# Patient Record
Sex: Female | Born: 1945 | Race: Black or African American | Hispanic: No | Marital: Single | Smoking: Never smoker
Health system: Southern US, Community
[De-identification: ages and names within clinical notes are randomized; demographics above are authoritative.]

## PROBLEM LIST (undated history)

## (undated) DIAGNOSIS — E119 Type 2 diabetes mellitus without complications: Secondary | ICD-10-CM

## (undated) DIAGNOSIS — I10 Essential (primary) hypertension: Secondary | ICD-10-CM

## (undated) DIAGNOSIS — I2510 Atherosclerotic heart disease of native coronary artery without angina pectoris: Secondary | ICD-10-CM

---

## 2019-05-08 ENCOUNTER — Emergency Department (HOSPITAL_COMMUNITY)
Admission: EM | Admit: 2019-05-08 | Discharge: 2019-05-09 | Disposition: A | Discharge status: Home / Self Care | Payer: Medicare Other | Attending: Emergency Medicine | Admitting: Emergency Medicine

## 2019-05-08 ENCOUNTER — Other Ambulatory Visit: Payer: Self-pay

## 2019-05-08 ENCOUNTER — Encounter (HOSPITAL_COMMUNITY): Payer: Self-pay

## 2019-05-08 ENCOUNTER — Emergency Department (HOSPITAL_COMMUNITY): Payer: Medicare Other

## 2019-05-08 DIAGNOSIS — I10 Essential (primary) hypertension: Secondary | ICD-10-CM | POA: Diagnosis not present

## 2019-05-08 DIAGNOSIS — E119 Type 2 diabetes mellitus without complications: Secondary | ICD-10-CM | POA: Diagnosis not present

## 2019-05-08 DIAGNOSIS — I259 Chronic ischemic heart disease, unspecified: Secondary | ICD-10-CM | POA: Insufficient documentation

## 2019-05-08 DIAGNOSIS — M25512 Pain in left shoulder: Secondary | ICD-10-CM | POA: Diagnosis present

## 2019-05-08 DIAGNOSIS — T148XXA Other injury of unspecified body region, initial encounter: Secondary | ICD-10-CM

## 2019-05-08 HISTORY — DX: Atherosclerotic heart disease of native coronary artery without angina pectoris: I25.10

## 2019-05-08 HISTORY — DX: Type 2 diabetes mellitus without complications: E11.9

## 2019-05-08 HISTORY — DX: Essential (primary) hypertension: I10

## 2019-05-08 LAB — CBC
HCT: 39.3 % (ref 36.0–46.0)
Hemoglobin: 12.6 g/dL (ref 12.0–15.0)
MCH: 30.4 pg (ref 26.0–34.0)
MCHC: 32.1 g/dL (ref 30.0–36.0)
MCV: 94.7 fL (ref 80.0–100.0)
Platelets: 225 10*3/uL (ref 150–400)
RBC: 4.15 MIL/uL (ref 3.87–5.11)
RDW: 12 % (ref 11.5–15.5)
WBC: 6.3 10*3/uL (ref 4.0–10.5)
nRBC: 0 % (ref 0.0–0.2)

## 2019-05-08 LAB — BASIC METABOLIC PANEL
Anion gap: 11 (ref 5–15)
BUN: 13 mg/dL (ref 8–23)
CO2: 28 mmol/L (ref 22–32)
Calcium: 9 mg/dL (ref 8.9–10.3)
Chloride: 101 mmol/L (ref 98–111)
Creatinine, Ser: 1 mg/dL (ref 0.44–1.00)
GFR calc Af Amer: >60 mL/min (ref >60)
GFR calc non Af Amer: 55 mL/min — ABNORMAL LOW (ref >60)
Glucose, Bld: 165 mg/dL — ABNORMAL HIGH (ref 70–99)
Potassium: 3.5 mmol/L (ref 3.5–5.1)
Sodium: 140 mmol/L (ref 135–145)

## 2019-05-08 LAB — TROPONIN I (HIGH SENSITIVITY)
Troponin I (High Sensitivity): 3 ng/L (ref <18)
Troponin I (High Sensitivity): 4 ng/L (ref <18)

## 2019-05-08 MED ORDER — KETOROLAC TROMETHAMINE 60 MG/2ML IM SOLN: 30.0000 mg | Freq: Once | INTRAMUSCULAR | Status: DC

## 2019-05-08 MED ORDER — KETOROLAC TROMETHAMINE 60 MG/2ML IM SOLN
30.0000 mg | Freq: Once | INTRAMUSCULAR | Status: AC
  Administered 2019-05-08: 30 mg via INTRAMUSCULAR
  Filled 2019-05-08: qty 2

## 2019-05-08 MED ORDER — KETOROLAC TROMETHAMINE 15 MG/ML IJ SOLN: 15.0000 mg | Freq: Once | INTRAMUSCULAR | Status: DC

## 2019-05-08 MED ORDER — LIDOCAINE 5 % EX PTCH
1.0000 | MEDICATED_PATCH | CUTANEOUS | Status: DC
  Administered 2019-05-08: 1 via TRANSDERMAL
  Filled 2019-05-08: qty 1

## 2019-05-08 MED ORDER — SODIUM CHLORIDE 0.9% FLUSH: 3.0000 mL | Freq: Once | INTRAVENOUS | Status: DC

## 2019-05-08 NOTE — ED Triage Notes (Signed)
Pt arrives POV for eval of abnormal EKG. Pt reports that she started on Sunday w/ a "crook in her neck", and tried OTC abx. Pt reports that she went to PCP for eval and noted that she had an "abnormal EKG". Pt denies CP/SOB. Endorses ongoing neck/shoulder pain

## 2019-05-09 ENCOUNTER — Encounter (HOSPITAL_COMMUNITY): Payer: Self-pay | Admitting: Emergency Medicine

## 2019-05-09 MED ORDER — DICLOFENAC SODIUM 1 % TD GEL: 4.0000 g | Freq: Four times a day (QID) | TRANSDERMAL | 0 refills | Status: AC

## 2019-05-09 MED ORDER — LIDOCAINE 5 % EX PTCH: 1.0000 | MEDICATED_PATCH | CUTANEOUS | 0 refills | Status: AC

## 2019-05-09 NOTE — ED Notes (Signed)
Discharge reviewed with pt and family. Verbalized understanding

## 2019-05-09 NOTE — ED Provider Notes (Signed)
[facility]PART[facility] Provider Note   CSN: [id number] Arrival date & time: 05/08/19  1603     History   Chief Complaint Chief Complaint  Patient presents with  . Neck Pain  . Shoulder Pain    HPI Catherine Blevins is a 73 y.o. female.     The history is provided by the patient.  Shoulder Pain Location:  Shoulder Shoulder location:  L shoulder Injury: no   Pain details:    Quality:  Aching and cramping   Radiates to:  Does not radiate   Severity:  Moderate   Onset quality:  Gradual   Timing:  Constant   Progression:  Unchanged Dislocation: no   Foreign body present:  No foreign bodies Prior injury to area:  No Relieved by:  Nothing Worsened by:  Nothing Ineffective treatments:  None tried Associated symptoms: stiffness   Associated symptoms: no back pain, no decreased range of motion, no fatigue, no fever, no muscle weakness, no neck pain and no numbness   Risk factors: no concern for non-accidental trauma   Seen for pain at urgent care and they did EKG and said it was abnormal and sent patient in.  Patient denies CP, DOE, n/v/d.  No weakness no numbness.  Had a recent normal cardiac catheterization in [place].  Has her old EKG with her.    Past Medical History:  Diagnosis Date  . Coronary artery disease   . Diabetes mellitus without complication ([place])   . Hypertension     There are no active problems to display for this patient.   History reviewed. No pertinent surgical history.   OB History   No obstetric history on file.      Home Medications    Prior to Admission medications   Not on File    Family History History reviewed. No pertinent family history.  Social History Social History   Tobacco Use  . Smoking status: Never Smoker  . Smokeless tobacco: Never Used  Substance Use Topics  . Alcohol use: Never    Frequency: Never  . Drug use: Never     Allergies   Patient has no known allergies.   Review of  Systems Review of Systems  Constitutional: Negative for fatigue and fever.  HENT: Negative for sore throat.   Eyes: Negative for visual disturbance.  Respiratory: Negative for chest tightness and shortness of breath.   Cardiovascular: Negative for chest pain, palpitations and leg swelling.  Gastrointestinal: Negative for abdominal pain.  Genitourinary: Negative for dysuria.  Musculoskeletal: Positive for arthralgias, myalgias and stiffness. Negative for back pain and neck pain.  Neurological: Negative for headaches.  Psychiatric/Behavioral: Negative for agitation.  All other systems reviewed and are negative.    Physical Exam Updated Vital Signs BP (!) 156/91   Pulse (!) 106   Temp 98.8 F (37.1 C)   Resp (!) 22   Ht 5\' 6"  (1.676 m)   Wt 89.8 kg   SpO2 100%   BMI 31.96 kg/m   Physical Exam Vitals signs and nursing note reviewed.  Constitutional:      General: She is not in acute distress.    Appearance: She is normal weight.  HENT:     Head: Normocephalic and atraumatic.     Nose: Nose normal.     Mouth/Throat:     Mouth: Mucous membranes are moist.     Pharynx: Oropharynx is clear.  Eyes:     Conjunctiva/sclera: Conjunctivae normal.  Pupils: Pupils are equal, round, and reactive to light.  Neck:     Musculoskeletal: Normal range of motion and neck supple.  Cardiovascular:     Rate and Rhythm: Normal rate and regular rhythm.     Pulses: Normal pulses.     Heart sounds: Normal heart sounds.  Pulmonary:     Effort: Pulmonary effort is normal.     Breath sounds: Normal breath sounds.  Abdominal:     General: Abdomen is flat. Bowel sounds are normal.     Tenderness: There is no abdominal tenderness. There is no guarding.  Genitourinary:    General: Normal vulva.     Rectum: Normal.  Musculoskeletal: Normal range of motion.     Left shoulder: Normal.     Left elbow: Normal.     Left wrist: Normal.       Arms:     Left hand: Normal. She exhibits normal  capillary refill. Normal sensation noted. Normal strength noted.  Neurological:     Mental Status: She is alert.      ED Treatments / Results  Labs (all labs ordered are listed, but only abnormal results are displayed) Results for orders placed or performed during the hospital encounter of 05/08/19  Basic metabolic panel  Result Value Ref Range   Sodium 140 135 - 145 mmol/L   Potassium 3.5 3.5 - 5.1 mmol/L   Chloride 101 98 - 111 mmol/L   CO2 28 22 - 32 mmol/L   Glucose, Bld 165 (H) 70 - 99 mg/dL   BUN 13 8 - 23 mg/dL   Creatinine, Ser 1.611.00 0.44 - 1.00 mg/dL   Calcium 9.0 8.9 - 09.610.3 mg/dL   GFR calc non Af Amer 55 (L) >60 mL/min   GFR calc Af Amer >60 >60 mL/min   Anion gap 11 5 - 15  CBC  Result Value Ref Range   WBC 6.3 4.0 - 10.5 K/uL   RBC 4.15 3.87 - 5.11 MIL/uL   Hemoglobin 12.6 12.0 - 15.0 g/dL   HCT 04.539.3 40.936.0 - 81.146.0 %   MCV 94.7 80.0 - 100.0 fL   MCH 30.4 26.0 - 34.0 pg   MCHC 32.1 30.0 - 36.0 g/dL   RDW 91.412.0 78.211.5 - 95.615.5 %   Platelets 225 150 - 400 K/uL   nRBC 0.0 0.0 - 0.2 %  Troponin I (High Sensitivity)  Result Value Ref Range   Troponin I (High Sensitivity) 3 <18 ng/L  Troponin I (High Sensitivity)  Result Value Ref Range   Troponin I (High Sensitivity) 4 <18 ng/L   Dg Chest 2 View  Result Date: 05/08/2019 CLINICAL DATA:  Chest pain EXAM: CHEST - 2 VIEW COMPARISON:  None. FINDINGS: The heart size and mediastinal contours are within normal limits. Overlying median sternotomy wires and surgical clips are seen. both lungs are clear. The visualized skeletal structures are unremarkable. IMPRESSION: No active cardiopulmonary disease. Electronically Signed   By: Jonna ClarkBindu  Avutu M.D.   On: 05/08/2019 17:10    EKG EKG Interpretation  Date/Time:  Thursday May 08 2019 16:16:20 EDT Ventricular Rate:  85 PR Interval:  156 QRS Duration: 144 QT Interval:  446 QTC Calculation: 530 R Axis:   -116 Text Interpretation:  Normal sinus rhythm Right bundle branch block  Anterolateral infarct , age undetermined Confirmed by Nicanor AlconPalumbo, Bane Hagy ([id number]) on 05/08/2019 11:04:35 PM   Radiology Dg Chest 2 View  Result Date: 05/08/2019 CLINICAL DATA:  Chest pain EXAM: CHEST - 2 VIEW COMPARISON:  None. FINDINGS: The heart size and mediastinal contours are within normal limits. Overlying median sternotomy wires and surgical clips are seen. both lungs are clear. The visualized skeletal structures are unremarkable. IMPRESSION: No active cardiopulmonary disease. Electronically Signed   By: Jonna ClarkBindu  Avutu M.D.   On: 05/08/2019 17:10    Procedures Procedures (including critical care time)  Medications Ordered in ED Medications  sodium chloride flush (NS) 0.9 % injection 3 mL (has no administration in time range)  lidocaine (LIDODERM) 5 % 1 patch (1 patch Transdermal Patch Applied 05/08/19 2354)  ketorolac (TORADOL) injection 30 mg (30 mg Intramuscular Given 05/08/19 2354)      Symptoms are reproducible and clearly MSK in nature.  Ruled out for MI in the ED>  I have reviewed outside EKG and our EKG is the same as her previous.   Final Clinical Impressions(s) / ED Diagnoses    Return for intractable cough, coughing up blood,fevers >100.4 unrelieved by medication, shortness of breath, intractable vomiting, chest pain, shortness of breath, weakness,numbness, changes in speech, facial asymmetry,abdominal pain, passing out,Inability to tolerate liquids or food, cough, altered mental status or any concerns. No signs of systemic illness or infection. The patient is nontoxic-appearing on exam and vital signs are within normal limits.   I have reviewed the triage vital signs and the nursing notes. Pertinent labs &imaging results that were available during my care of the patient were reviewed by me and considered in my medical decision making (see chart for details).  After history, exam, and medical workup I feel the patient has been appropriately medically screened and is  safe for discharge home. Pertinent diagnoses were discussed with the patient. Patient was given return precautions   Deiona Hooper, MD 05/09/19 [id number]

## 2020-09-01 IMAGING — DX CHEST - 2 VIEW
2 series · 2 of 2 positions shown · non-contrast
Comparison: None.

CLINICAL DATA: Chest pain

EXAM:
CHEST - 2 VIEW

[chest pa]
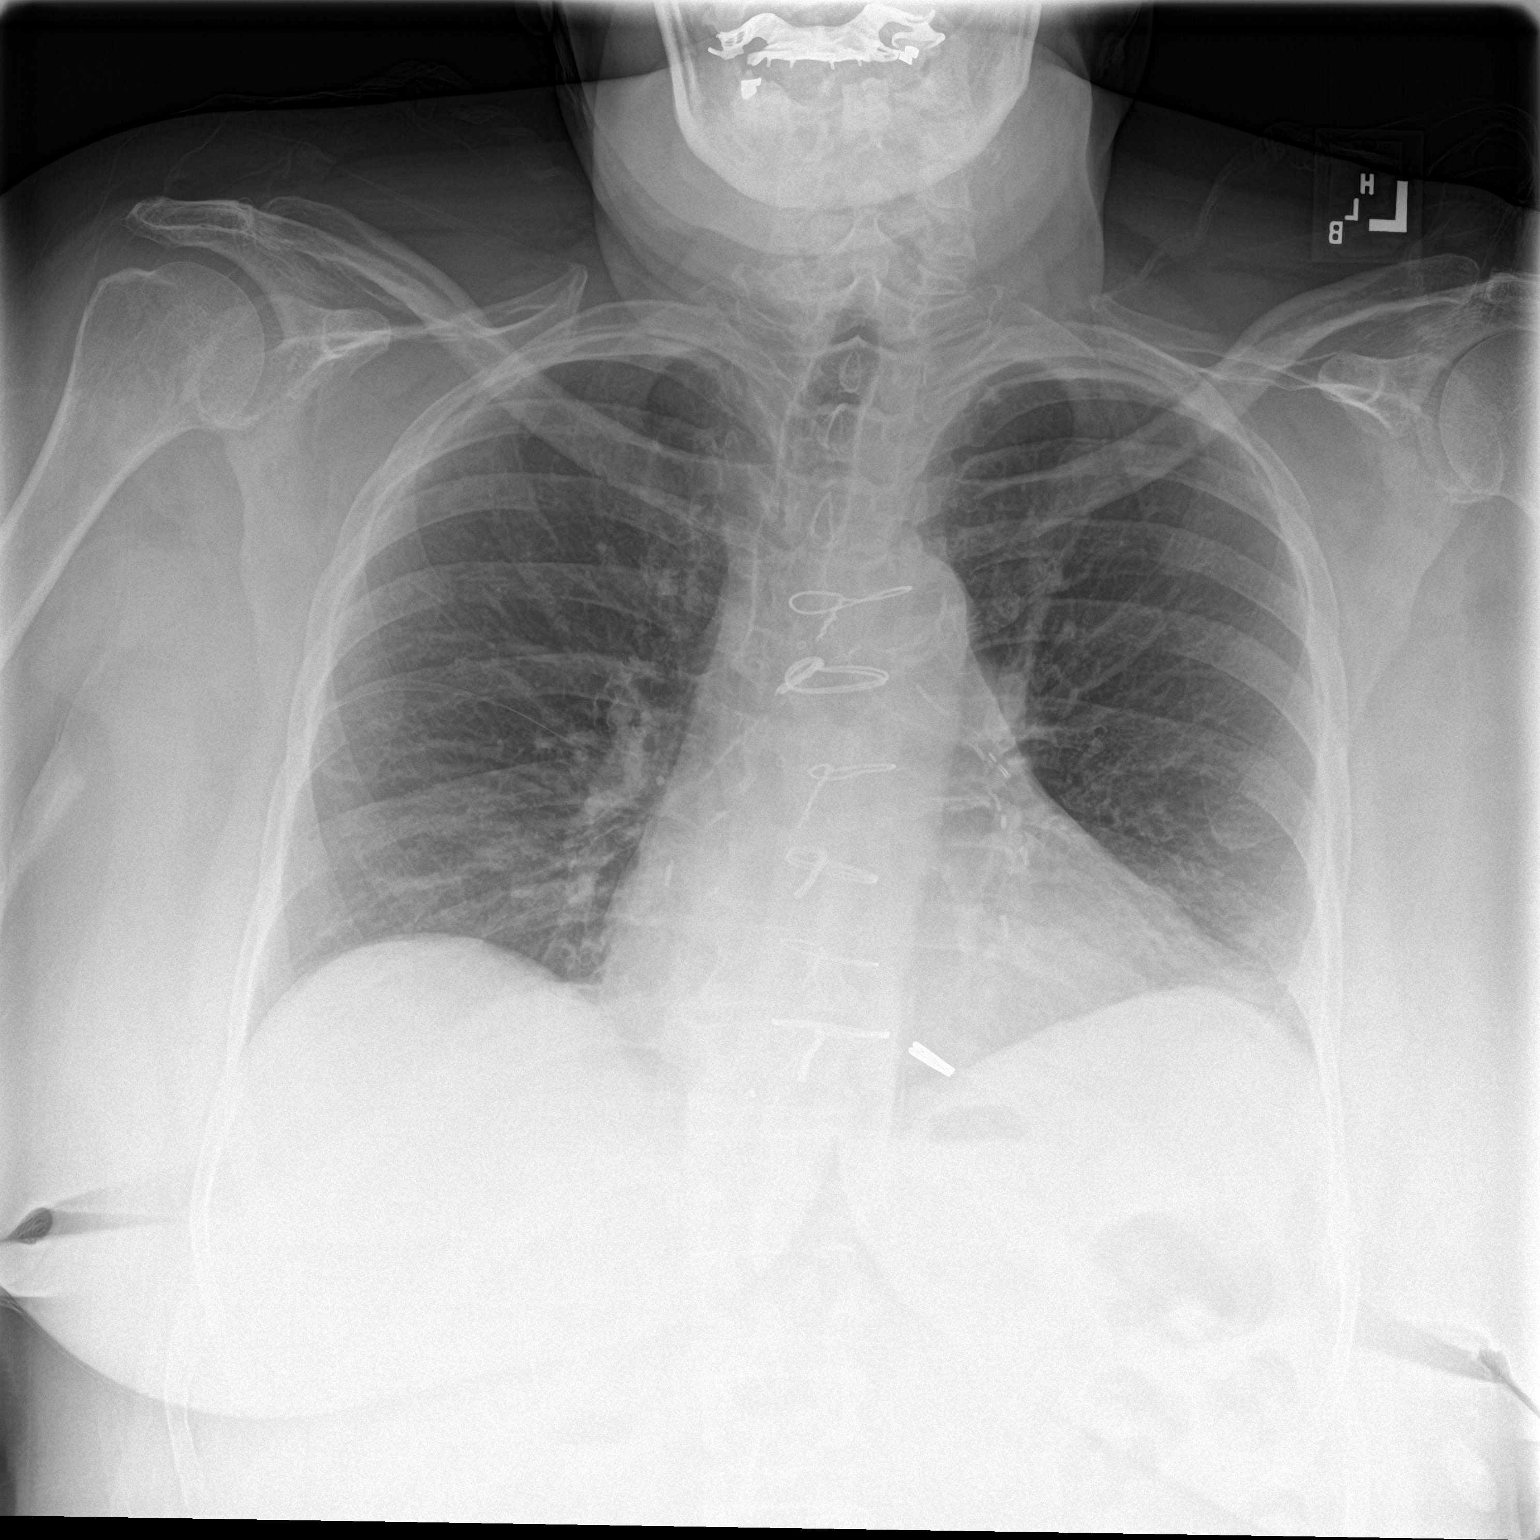

[chest lat]
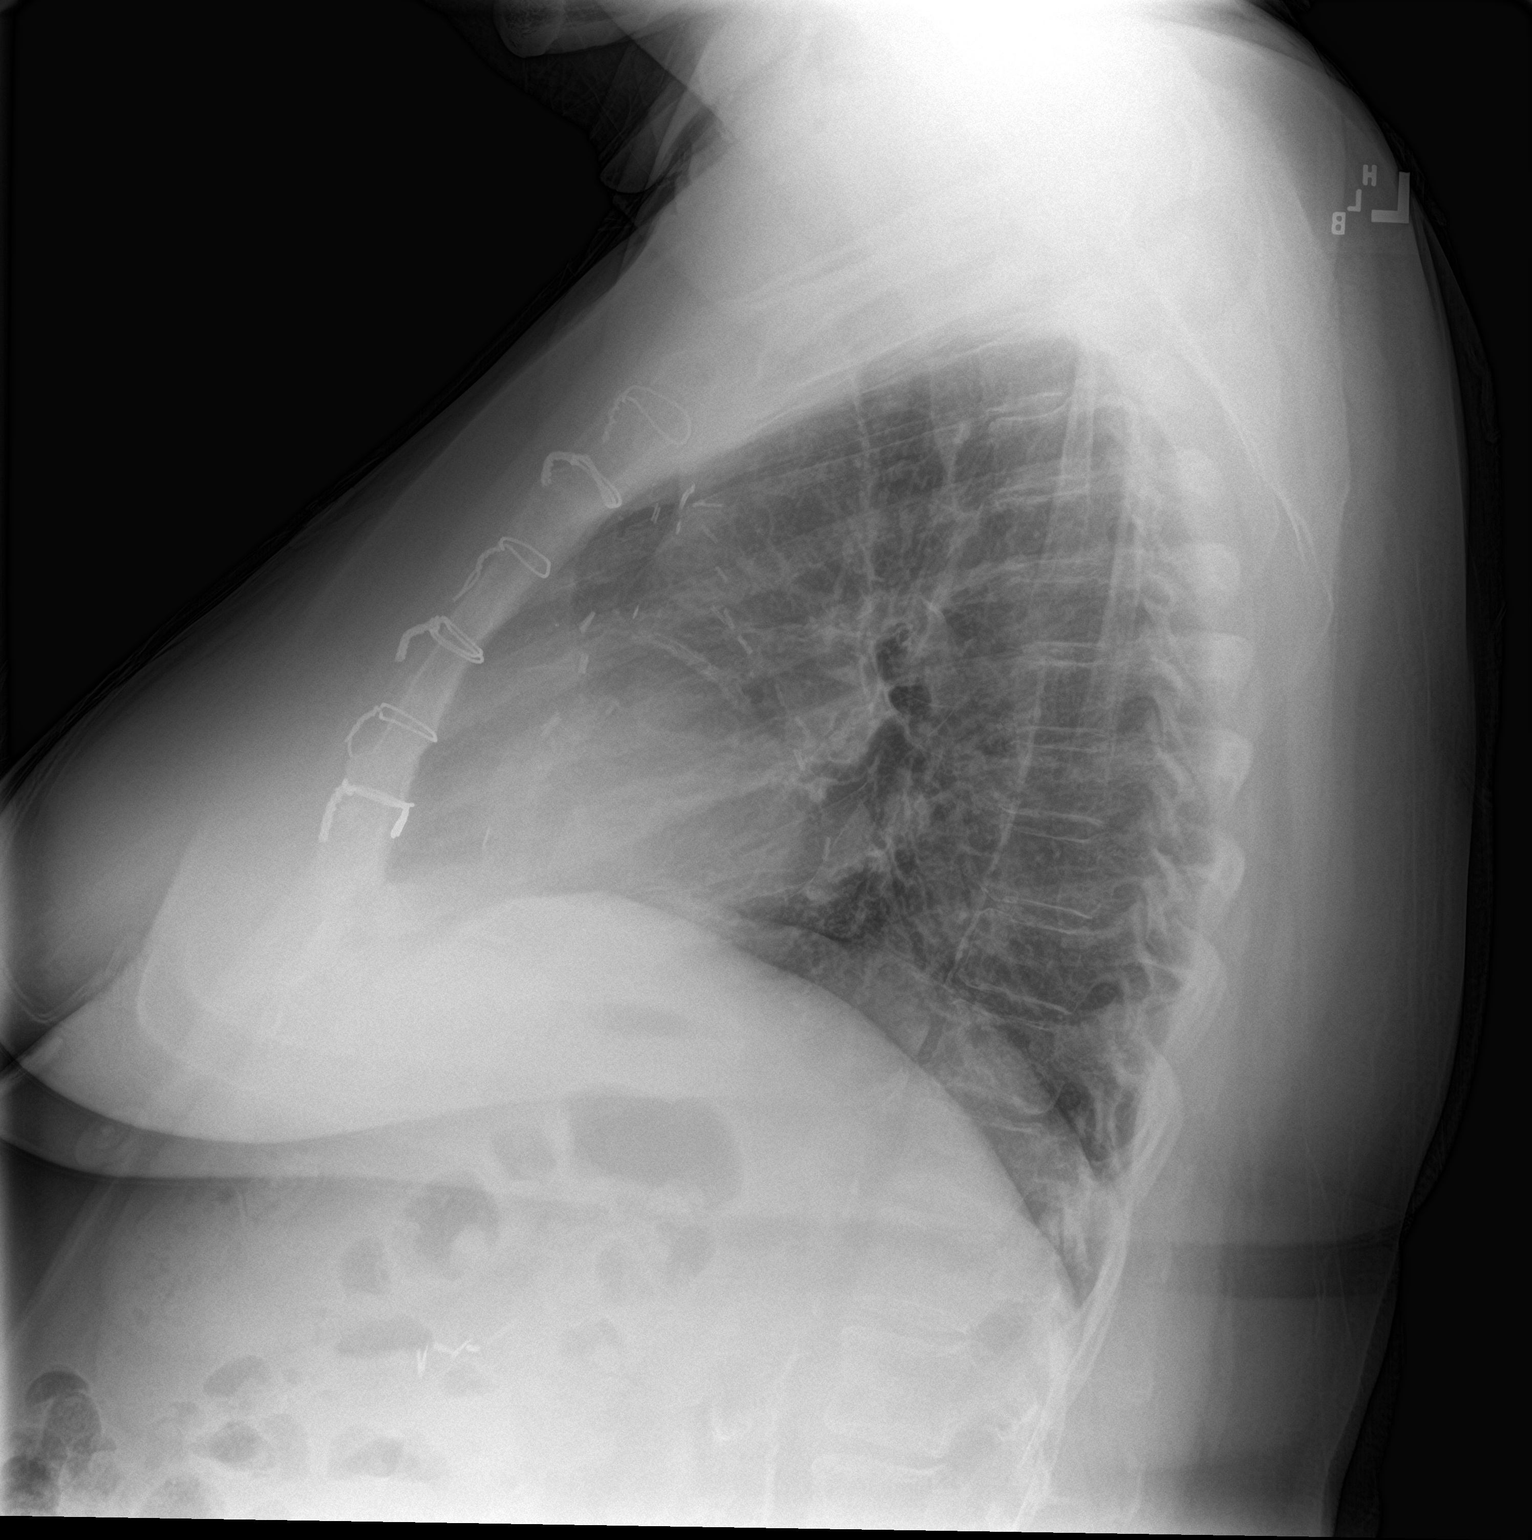

[2 of 2 positions shown; findings below may reference images not displayed]

FINDINGS: The heart size and mediastinal contours are within normal limits.
Overlying median sternotomy wires and surgical clips are seen. both
lungs are clear. The visualized skeletal structures are
unremarkable.
IMPRESSION: No active cardiopulmonary disease.

## 2024-04-18 DEATH — deceased
